# Patient Record
Sex: Male | Born: 1950 | Race: White | Hispanic: No | Marital: Single | Smoking: Former smoker
Health system: Southern US, Community
[De-identification: ages and names within clinical notes are randomized; demographics above are authoritative.]

## PROBLEM LIST (undated history)

## (undated) DIAGNOSIS — J45909 Unspecified asthma, uncomplicated: Secondary | ICD-10-CM

## (undated) DIAGNOSIS — I10 Essential (primary) hypertension: Secondary | ICD-10-CM

## (undated) HISTORY — DX: Unspecified asthma, uncomplicated: J45.909

## (undated) HISTORY — DX: Essential (primary) hypertension: I10

## (undated) HISTORY — PX: TONSILLECTOMY: SUR1361

---

## 2005-06-19 ENCOUNTER — Encounter: Admission: RE | Admit: 2005-06-19 | Discharge: 2005-06-19 | Payer: Self-pay | Admitting: Occupational Medicine

## 2006-03-16 ENCOUNTER — Ambulatory Visit: Payer: Self-pay | Admitting: Gastroenterology

## 2006-03-30 ENCOUNTER — Ambulatory Visit: Payer: Self-pay | Admitting: Gastroenterology

## 2008-05-16 ENCOUNTER — Encounter: Admission: RE | Admit: 2008-05-16 | Discharge: 2008-05-16 | Payer: Self-pay | Admitting: Unknown Physician Specialty

## 2009-11-01 IMAGING — CR DG CHEST 2V
2 series · 2 of 2 positions shown · non-contrast
Comparison: None

CLINICAL DATA: Nonsmoker with wheezing and cough.

CHEST - 2 VIEW

[view not recorded (1 of 2)]
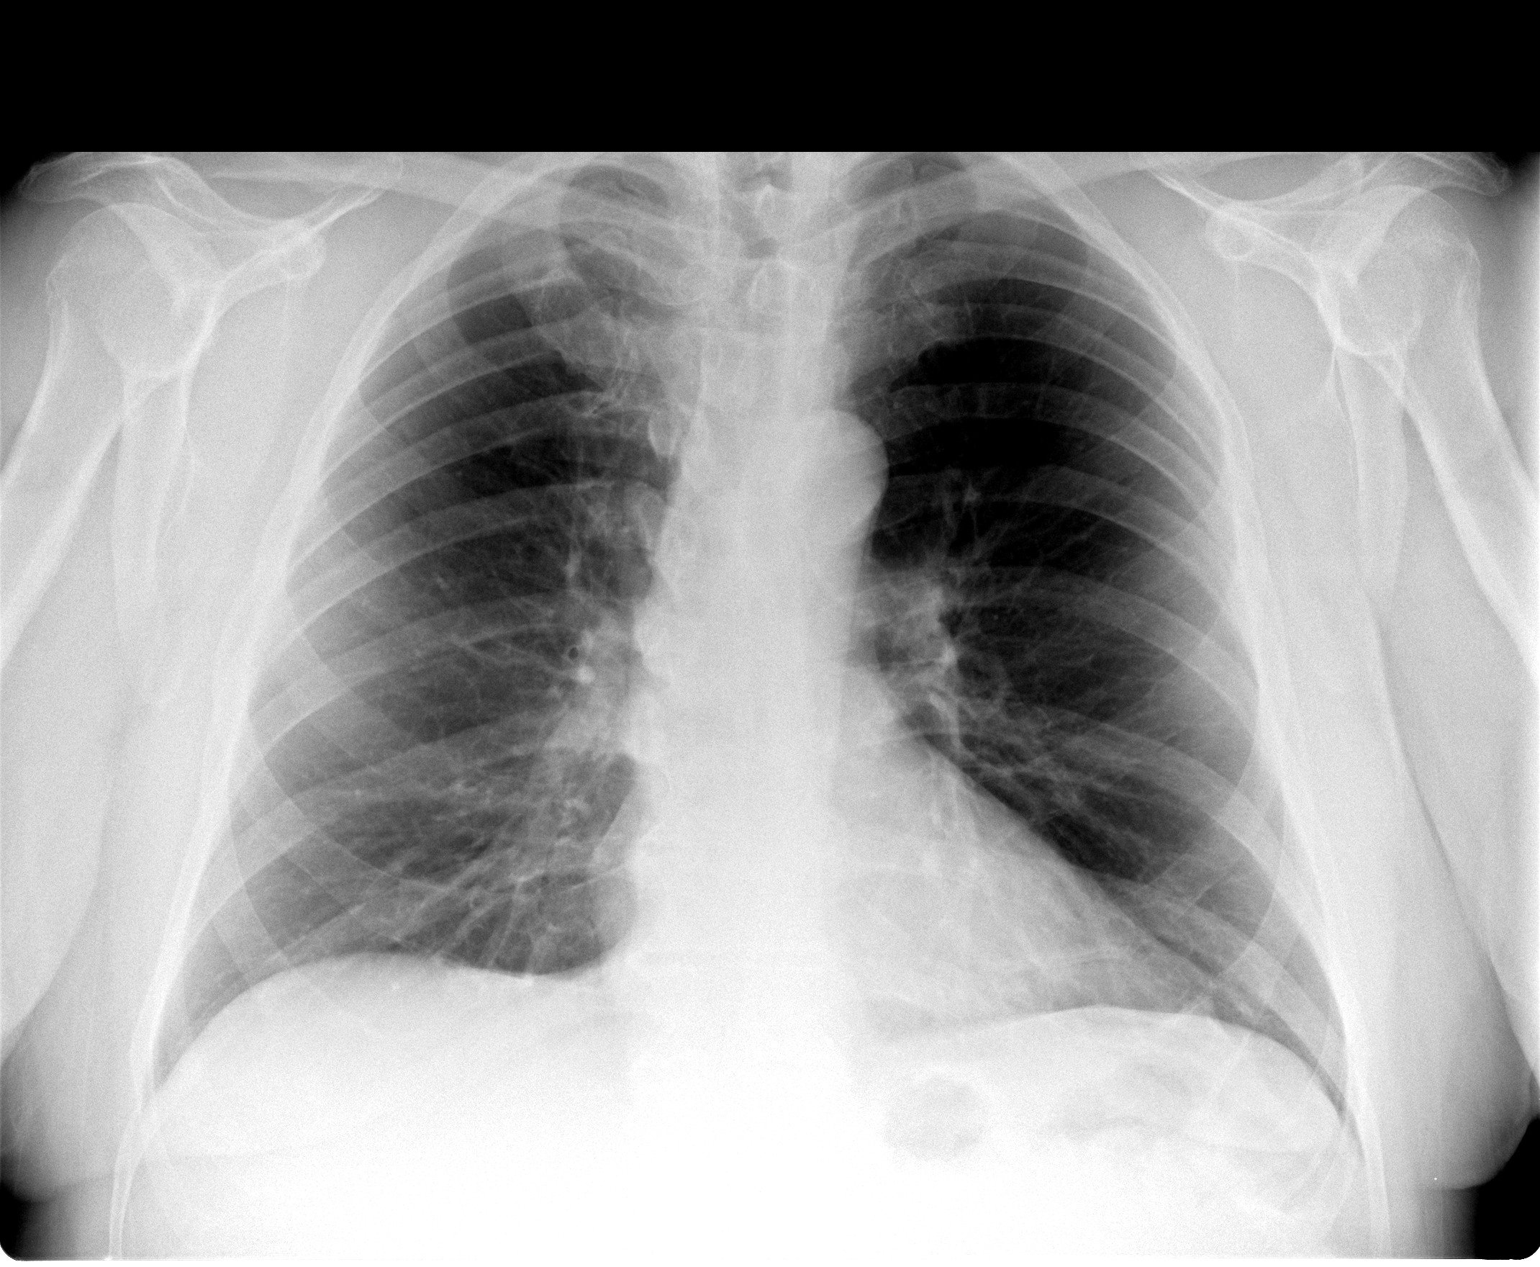

[view not recorded (2 of 2)]
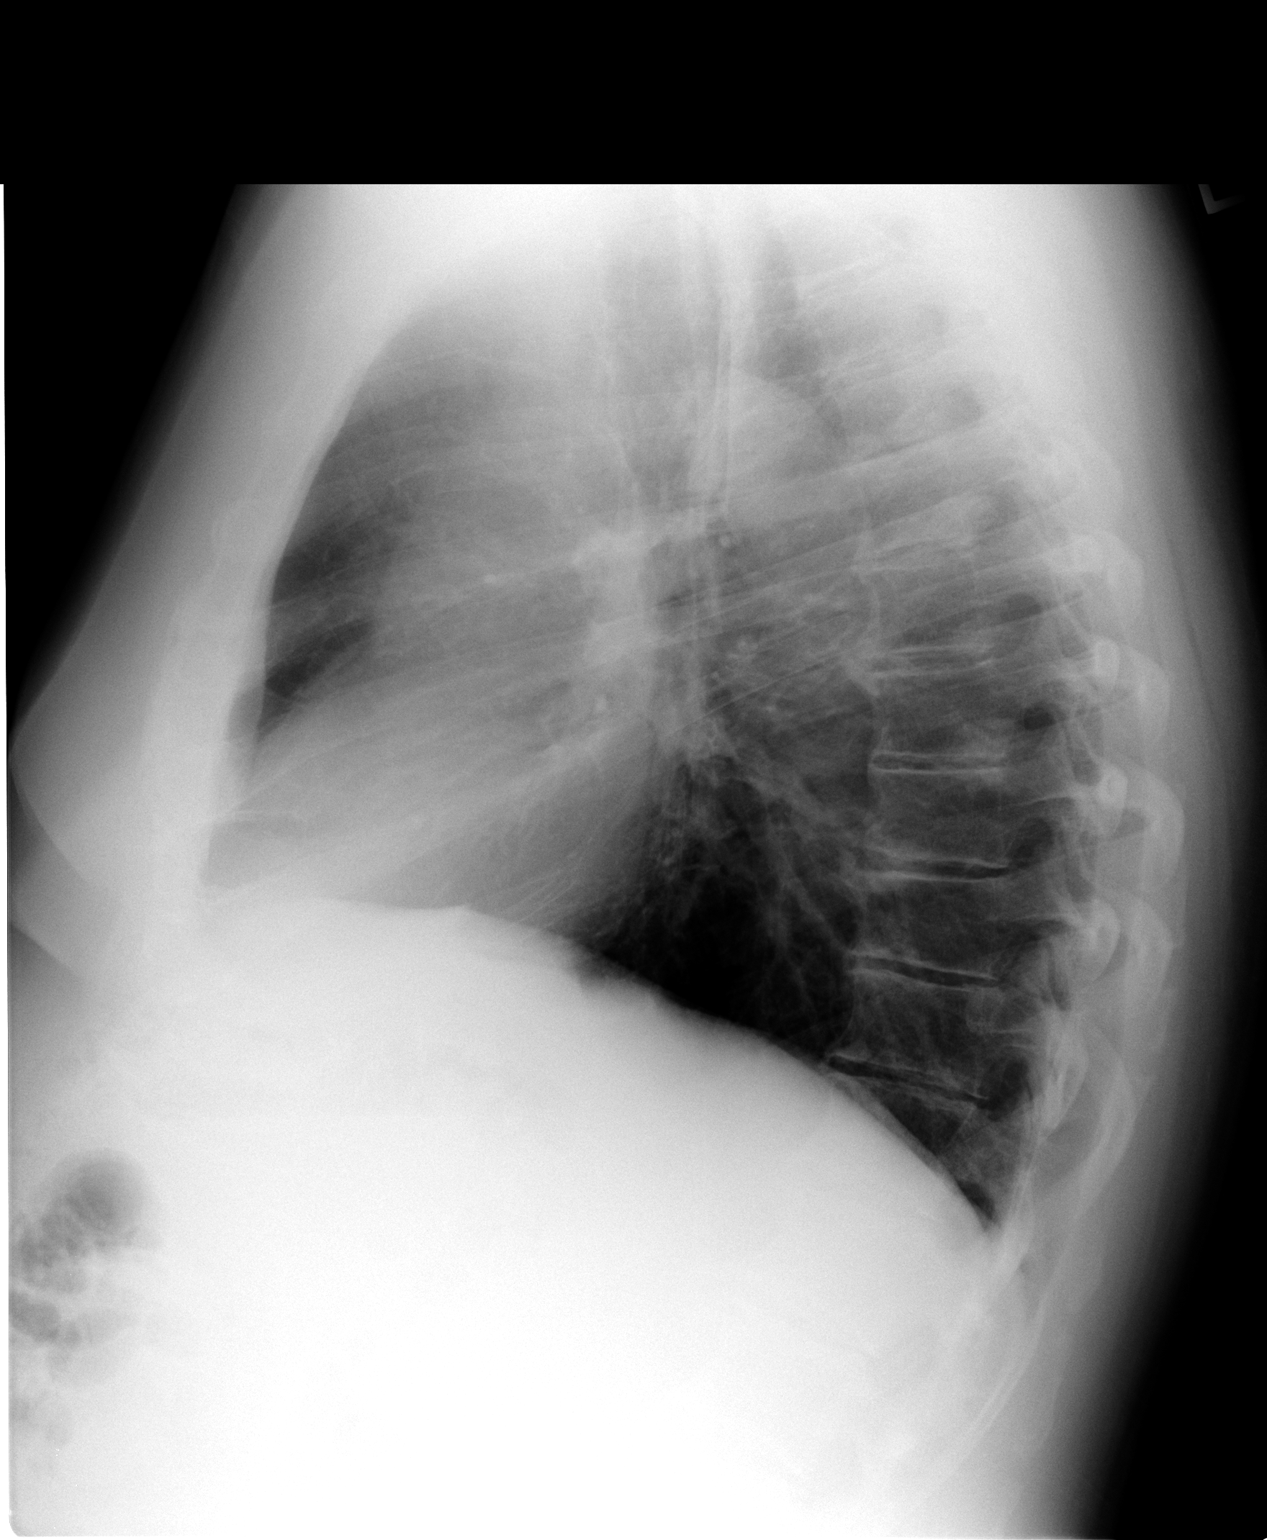

[2 of 2 positions shown; findings below may reference images not displayed]

FINDINGS: The heart size and mediastinal contours are normal.  The
lungs are clear.  There is no pleural effusion or pneumothorax.  No
acute osseous findings are seen.
IMPRESSION: No active cardiopulmonary process demonstrated.

## 2010-05-06 ENCOUNTER — Encounter
Admission: RE | Admit: 2010-05-06 | Discharge: 2010-05-06 | Payer: Self-pay | Source: Home / Self Care | Attending: Unknown Physician Specialty | Admitting: Unknown Physician Specialty

## 2011-10-22 IMAGING — CR DG SHOULDER 2+V*L*
3 series · 3 of 3 positions shown · non-contrast
Comparison: None.

CLINICAL DATA: Left shoulder pain

LEFT SHOULDER - 2+ VIEW

[view not recorded (1 of 3)]
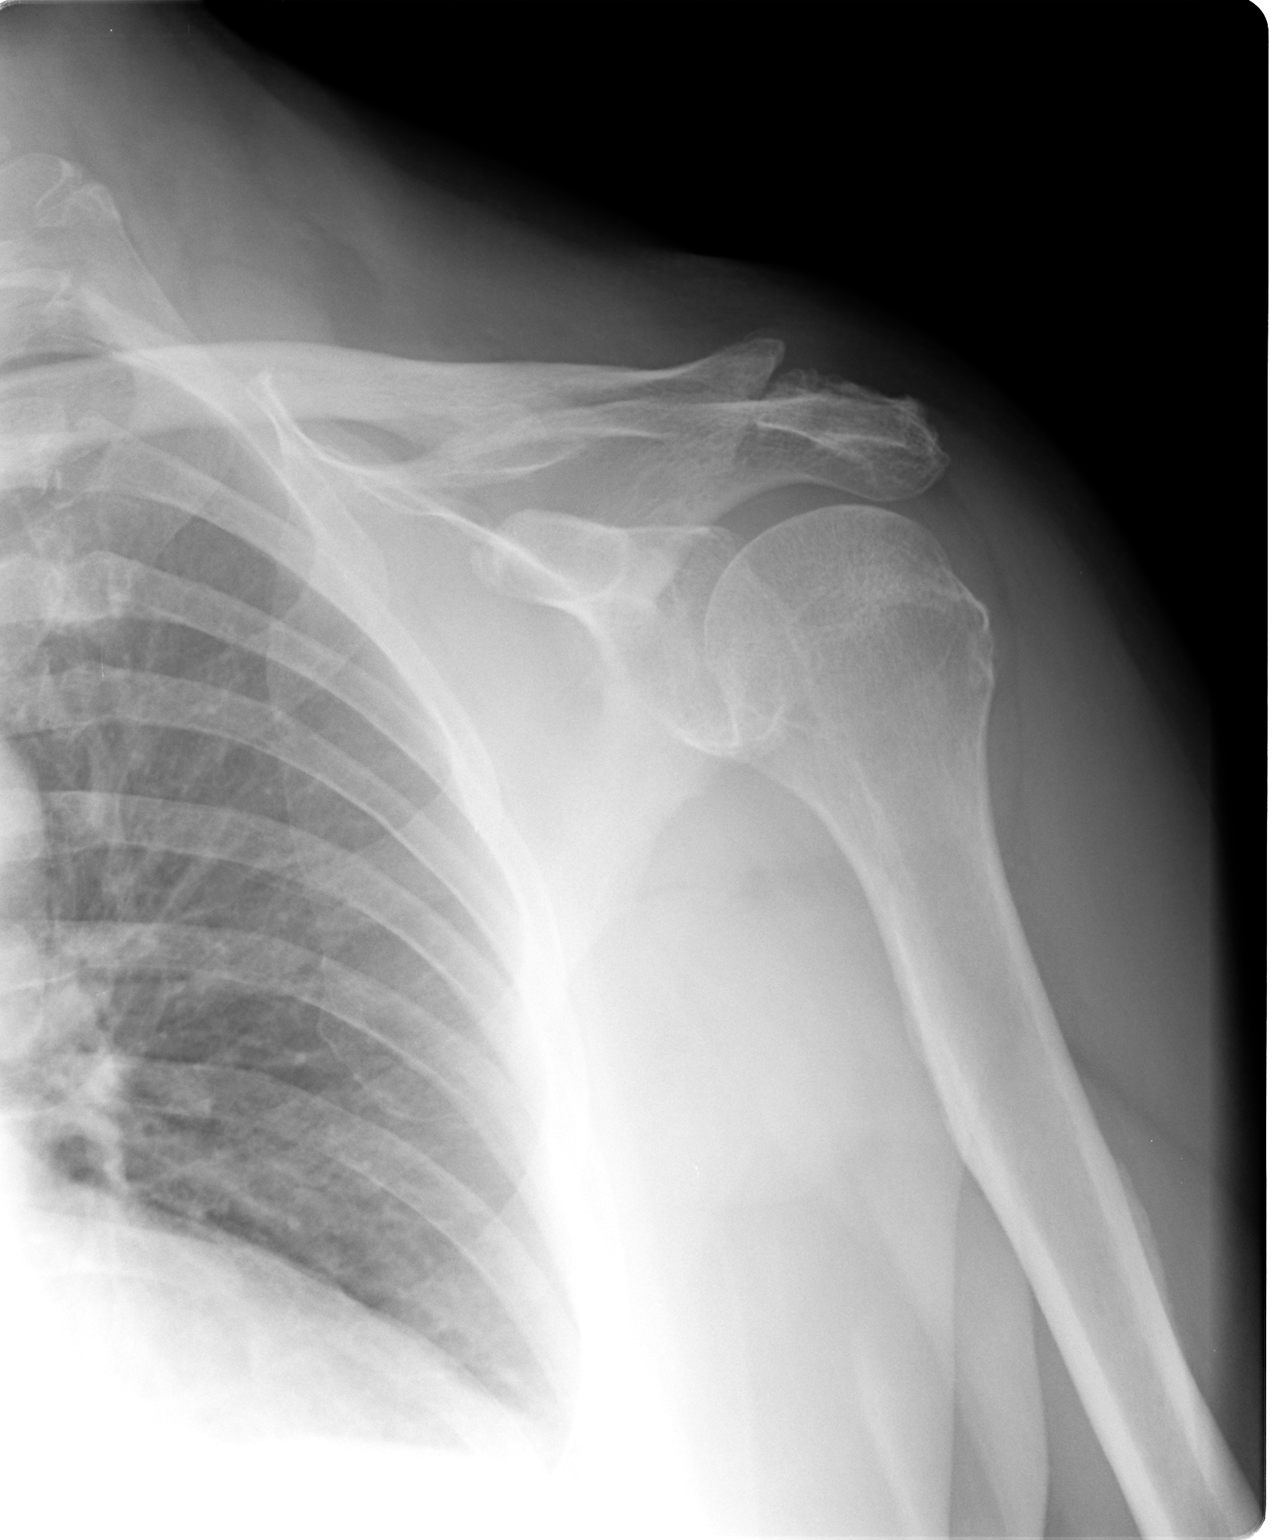

[view not recorded (2 of 3)]
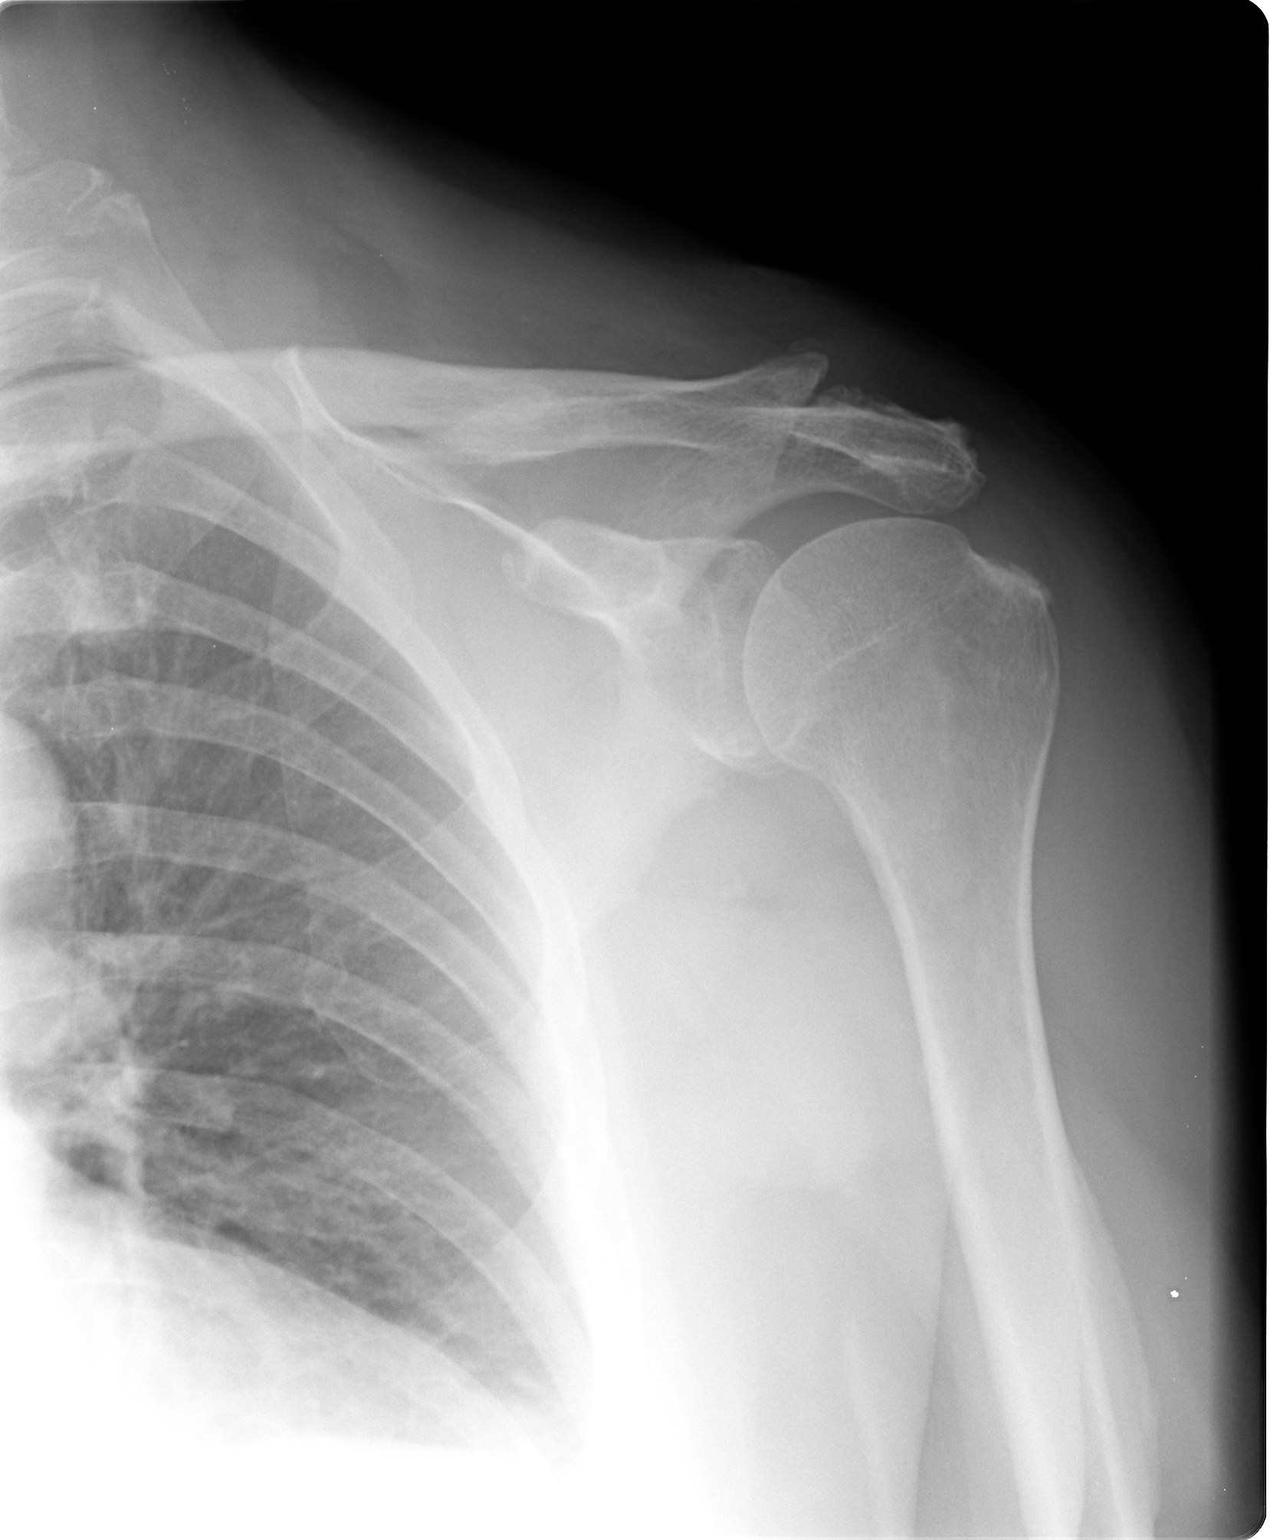

[view not recorded (3 of 3)]
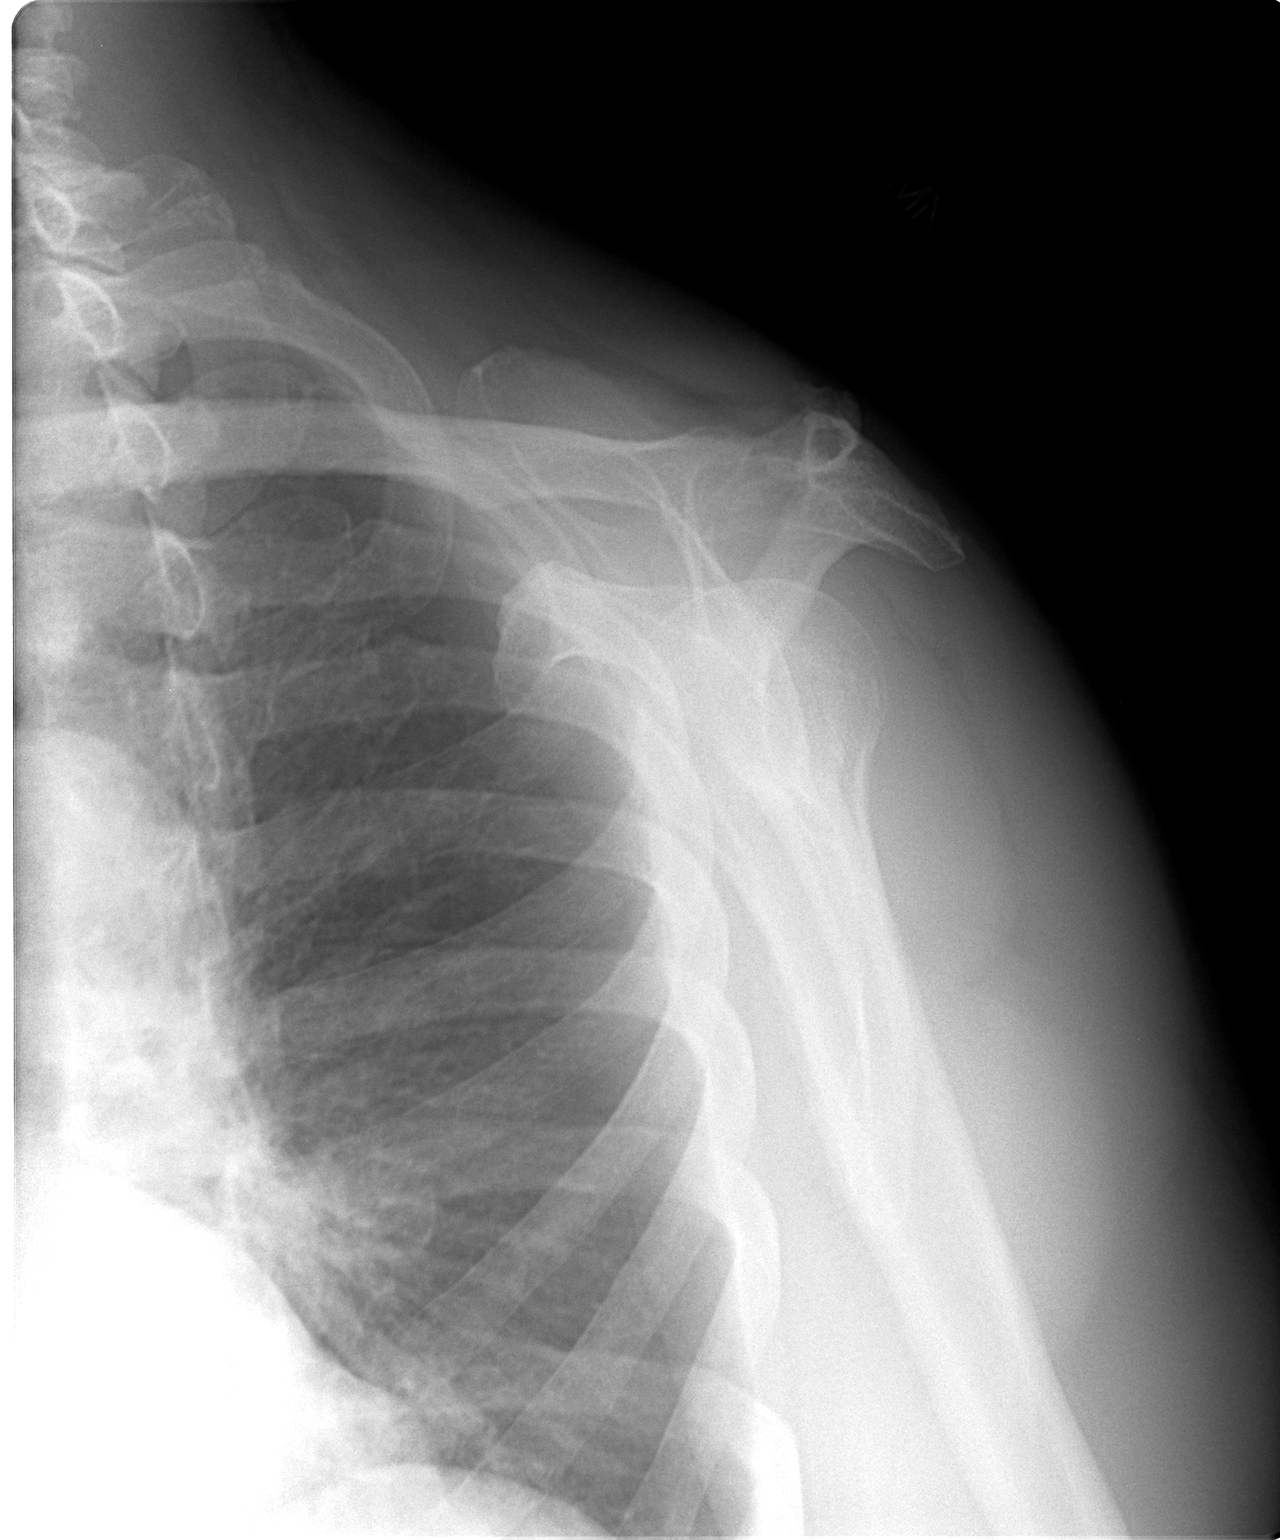

[3 of 3 positions shown; findings below may reference images not displayed]

FINDINGS: No fracture or dislocation.  There are significant
degenerative changes of the AC joint.  No soft tissue
calcifications.
IMPRESSION: Degenerative changes of the AC joint - otherwise unremarkable.

## 2015-05-27 HISTORY — PX: COLONOSCOPY: SHX174

## 2016-03-12 ENCOUNTER — Encounter: Payer: Self-pay | Admitting: Gastroenterology

## 2017-03-25 ENCOUNTER — Encounter: Payer: Self-pay | Admitting: Unknown Physician Specialty

## 2017-04-06 ENCOUNTER — Encounter: Payer: Self-pay | Admitting: Gastroenterology

## 2017-05-21 ENCOUNTER — Ambulatory Visit (AMBULATORY_SURGERY_CENTER): Payer: Self-pay | Admitting: *Deleted

## 2017-05-21 ENCOUNTER — Other Ambulatory Visit: Payer: Self-pay

## 2017-05-21 VITALS — Ht 71.0 in | Wt 234.0 lb

## 2017-05-21 DIAGNOSIS — Z1211 Encounter for screening for malignant neoplasm of colon: Secondary | ICD-10-CM

## 2017-05-21 MED ORDER — PEG-KCL-NACL-NASULF-NA ASC-C 140 G PO SOLR: 1.0000 | Freq: Once | ORAL | 0 refills | Status: AC

## 2017-05-21 NOTE — Progress Notes (Signed)
Patient denies any allergies to eggs or soy. Patient denies any problems with anesthesia/sedation. Patient denies any oxygen use at home. Patient denies taking any diet/weight loss medications or blood thinners. EMMI education declined by patient.

## 2017-06-01 ENCOUNTER — Telehealth: Payer: Self-pay | Admitting: Gastroenterology

## 2017-06-01 DIAGNOSIS — Z1211 Encounter for screening for malignant neoplasm of colon: Secondary | ICD-10-CM

## 2017-06-01 MED ORDER — PEG-KCL-NACL-NASULF-NA ASC-C 140 G PO SOLR: 1.0000 | ORAL | 0 refills | Status: DC

## 2017-06-01 NOTE — Telephone Encounter (Signed)
Called patient and spoke to wife and she states that the pharmacy did not have Plenvu in stock and would have to order it.  The patient's procedure is 1/9 so I left a sample for patient to come pick up.   Elizabeth Palau, CMA  [facility]

## 2017-06-01 NOTE — Telephone Encounter (Signed)
Sample given  Plenvu   Lot [id number]  Exp. 10/2018  1 kit

## 2017-06-03 ENCOUNTER — Other Ambulatory Visit: Payer: Self-pay

## 2017-06-03 ENCOUNTER — Ambulatory Visit (AMBULATORY_SURGERY_CENTER): Payer: 59 | Admitting: Gastroenterology

## 2017-06-03 ENCOUNTER — Encounter: Payer: Self-pay | Admitting: Gastroenterology

## 2017-06-03 VITALS — BP 126/73 | HR 69 | Temp 98.0°F | Resp 12 | Ht 71.0 in | Wt 234.0 lb

## 2017-06-03 DIAGNOSIS — K635 Polyp of colon: Secondary | ICD-10-CM

## 2017-06-03 DIAGNOSIS — Z1211 Encounter for screening for malignant neoplasm of colon: Secondary | ICD-10-CM | POA: Diagnosis not present

## 2017-06-03 DIAGNOSIS — D126 Benign neoplasm of colon, unspecified: Secondary | ICD-10-CM

## 2017-06-03 DIAGNOSIS — D122 Benign neoplasm of ascending colon: Secondary | ICD-10-CM

## 2017-06-03 DIAGNOSIS — D120 Benign neoplasm of cecum: Secondary | ICD-10-CM

## 2017-06-03 DIAGNOSIS — D123 Benign neoplasm of transverse colon: Secondary | ICD-10-CM

## 2017-06-03 MED ORDER — SODIUM CHLORIDE 0.9 % IV SOLN: 500.0000 mL | Freq: Once | INTRAVENOUS | Status: DC

## 2017-06-03 NOTE — Progress Notes (Signed)
A/ox3 pleased with MAC, report to Angela RN 

## 2017-06-03 NOTE — Op Note (Addendum)
[place] Patient Name: Bryan Riley Procedure Date: 06/03/2017 9:14 AM MRN: [id number] Endoscopist: Remo Lipps P. Armbruster MD, MD Age: 67 Referring MD:  Date of Birth: March 21, 1951 Gender: Male Account #: [id number] Procedure:                Colonoscopy Indications:              Screening for colorectal malignant neoplasm Medicines:                Monitored Anesthesia Care Procedure:                Pre-Anesthesia Assessment:                           - Prior to the procedure, a History and Physical                            was performed, and patient medications and                            allergies were reviewed. The patient's tolerance of                            previous anesthesia was also reviewed. The risks                            and benefits of the procedure and the sedation                            options and risks were discussed with the patient.                            All questions were answered, and informed consent                            was obtained. Prior Anticoagulants: The patient has                            taken no previous anticoagulant or antiplatelet                            agents. ASA Grade Assessment: II - A patient with                            mild systemic disease. After reviewing the risks                            and benefits, the patient was deemed in                            satisfactory condition to undergo the procedure.                           After obtaining informed consent, the colonoscope  was passed under direct vision. Throughout the                            procedure, the patient's blood pressure, pulse, and                            oxygen saturations were monitored continuously. The                            Colonoscope was introduced through the anus and                            advanced to the the cecum, identified by                            appendiceal orifice and  ileocecal valve. The                            colonoscopy was performed without difficulty. The                            patient tolerated the procedure well. The quality                            of the bowel preparation was good. The ileocecal                            valve, appendiceal orifice, and rectum were                            photographed. Scope In: 9:18:15 AM Scope Out: 9:36:13 AM Scope Withdrawal Time: 0 hours 15 minutes 16 seconds  Total Procedure Duration: 0 hours 17 minutes 58 seconds  Findings:                 The perianal and digital rectal examinations were                            normal.                           A diminutive polyp was found in the ileocecal                            valve. The polyp was sessile. The polyp was removed                            with a cold biopsy forceps. Resection and retrieval                            were complete.                           A 5 mm polyp was found in the ascending colon. The  polyp was sessile. The polyp was removed with a                            cold snare. Resection and retrieval were complete.                           A 4 mm polyp was found in the hepatic flexure. The                            polyp was sessile. The polyp was removed with a                            cold snare. Resection was complete, but the polyp                            tissue was not retrieved (could not find the polyp                            following suctioning it within the channel of the                            colonoscope).                           Many medium-mouthed diverticula were found in the                            entire colon - mild in right and transverse colon,                            severe in left colon.                           Internal hemorrhoids were found during retroflexion.                           The exam was otherwise without  abnormality. Complications:            No immediate complications. Estimated blood loss:                            Minimal. Estimated Blood Loss:     Estimated blood loss was minimal. Impression:               - One diminutive polyp at the ileocecal valve,                            removed with a cold biopsy forceps. Resected and                            retrieved.                           - One 5 mm polyp in the ascending colon, removed  with a cold snare. Resected and retrieved.                           - One 4 mm polyp at the hepatic flexure, removed                            with a cold snare. Complete resection. Polyp tissue                            not retrieved.                           - Diverticulosis in the entire examined colon.                           - Internal hemorrhoids.                           - The examination was otherwise normal. Recommendation:           - Patient has a contact number available for                            emergencies. The signs and symptoms of potential                            delayed complications were discussed with the                            patient. Return to normal activities tomorrow.                            Written discharge instructions were provided to the                            patient.                           - Resume previous diet.                           - Continue present medications.                           - Await pathology results.                           - Repeat colonoscopy is recommended for                            surveillance. The colonoscopy date will be                            determined after pathology results from today's                            exam become available  for review.                           - No ibuprofen, naproxen, or other non-steroidal                            anti-inflammatory drugs for 2 weeks after polyp                             removal. Remo Lipps P. Armbruster MD, MD 06/03/2017 9:40:12 AM This report has been signed electronically.

## 2017-06-03 NOTE — Progress Notes (Signed)
Called to room to assist during endoscopic procedure.  Patient ID and intended procedure confirmed with present staff. Received instructions for my participation in the procedure from the performing physician.  

## 2017-06-03 NOTE — Progress Notes (Signed)
Pt's states no medical or surgical changes since previsit or office visit. 

## 2017-06-03 NOTE — Patient Instructions (Signed)
**  Handouts given to patient on hemorrhoids, polyps, and diverticulosis**   YOU HAD AN ENDOSCOPIC PROCEDURE TODAY AT [facility]:   Refer to the procedure report that was given to you for any specific questions about what was found during the examination.  If the procedure report does not answer your questions, please call your gastroenterologist to clarify.  If you requested that your care partner not be given the details of your procedure findings, then the procedure report has been included in a sealed envelope for you to review at your convenience later.  YOU SHOULD EXPECT: Some feelings of bloating in the abdomen. Passage of more gas than usual.  Walking can help get rid of the air that was put into your GI tract during the procedure and reduce the bloating. If you had a lower endoscopy (such as a colonoscopy or flexible sigmoidoscopy) you may notice spotting of blood in your stool or on the toilet paper. If you underwent a bowel prep for your procedure, you may not have a normal bowel movement for a few days.  Please Note:  You might notice some irritation and congestion in your nose or some drainage.  This is from the oxygen used during your procedure.  There is no need for concern and it should clear up in a day or so.  SYMPTOMS TO REPORT IMMEDIATELY:   Following lower endoscopy (colonoscopy or flexible sigmoidoscopy):  Excessive amounts of blood in the stool  Significant tenderness or worsening of abdominal pains  Swelling of the abdomen that is new, acute  Fever of 100F or higher  For urgent or emergent issues, a gastroenterologist can be reached at any hour by calling [phone].   DIET:  We do recommend a small meal at first, but then you may proceed to your regular diet.  Drink plenty of fluids but you should avoid alcoholic beverages for 24 hours.  ACTIVITY:  You should plan to take it easy for the rest of today and you should NOT DRIVE or use heavy machinery  until tomorrow (because of the sedation medicines used during the test).    FOLLOW UP: Our staff will call the number listed on your records the next business day following your procedure to check on you and address any questions or concerns that you may have regarding the information given to you following your procedure. If we do not reach you, we will leave a message.  However, if you are feeling well and you are not experiencing any problems, there is no need to return our call.  We will assume that you have returned to your regular daily activities without incident.  If any biopsies were taken you will be contacted by phone or by letter within the next 1-3 weeks.  Please call us at [phone] if you have not heard about the biopsies in 3 weeks.    SIGNATURES/CONFIDENTIALITY: You and/or your care partner have signed paperwork which will be entered into your electronic medical record.  These signatures attest to the fact that that the information above on your After Visit Summary has been reviewed and is understood.  Full responsibility of the confidentiality of this discharge information lies with you and/or your care-partner.

## 2017-06-04 ENCOUNTER — Telehealth: Payer: Self-pay | Admitting: *Deleted

## 2017-06-04 ENCOUNTER — Telehealth: Payer: Self-pay

## 2017-06-04 NOTE — Telephone Encounter (Signed)
  Follow up Call-  Call back number 06/03/2017  Post procedure Call Back phone  # [phone]  Permission to leave phone message Yes  Some recent data might be hidden   no answer, voicemail not set up so not able to leave a message

## 2017-06-04 NOTE — Telephone Encounter (Signed)
No voice mail set uo. Left no message

## 2017-06-09 ENCOUNTER — Encounter: Payer: Self-pay | Admitting: Gastroenterology

## 2018-08-03 DIAGNOSIS — E7800 Pure hypercholesterolemia, unspecified: Secondary | ICD-10-CM | POA: Diagnosis not present

## 2018-08-03 DIAGNOSIS — R739 Hyperglycemia, unspecified: Secondary | ICD-10-CM | POA: Diagnosis not present

## 2018-08-03 DIAGNOSIS — R7989 Other specified abnormal findings of blood chemistry: Secondary | ICD-10-CM | POA: Diagnosis not present

## 2018-08-03 DIAGNOSIS — I10 Essential (primary) hypertension: Secondary | ICD-10-CM | POA: Diagnosis not present

## 2018-09-14 DIAGNOSIS — L03119 Cellulitis of unspecified part of limb: Secondary | ICD-10-CM | POA: Diagnosis not present

## 2018-09-14 DIAGNOSIS — L309 Dermatitis, unspecified: Secondary | ICD-10-CM | POA: Diagnosis not present

## 2018-09-14 DIAGNOSIS — R600 Localized edema: Secondary | ICD-10-CM | POA: Diagnosis not present

## 2018-12-02 DIAGNOSIS — Z85828 Personal history of other malignant neoplasm of skin: Secondary | ICD-10-CM | POA: Diagnosis not present

## 2018-12-02 DIAGNOSIS — Z08 Encounter for follow-up examination after completed treatment for malignant neoplasm: Secondary | ICD-10-CM | POA: Diagnosis not present

## 2018-12-02 DIAGNOSIS — D1722 Benign lipomatous neoplasm of skin and subcutaneous tissue of left arm: Secondary | ICD-10-CM | POA: Diagnosis not present

## 2018-12-02 DIAGNOSIS — L2089 Other atopic dermatitis: Secondary | ICD-10-CM | POA: Diagnosis not present

## 2018-12-02 DIAGNOSIS — L821 Other seborrheic keratosis: Secondary | ICD-10-CM | POA: Diagnosis not present

## 2019-02-03 DIAGNOSIS — Z23 Encounter for immunization: Secondary | ICD-10-CM | POA: Diagnosis not present

## 2019-02-23 DIAGNOSIS — Z0000 Encounter for general adult medical examination without abnormal findings: Secondary | ICD-10-CM | POA: Diagnosis not present

## 2019-02-23 DIAGNOSIS — R739 Hyperglycemia, unspecified: Secondary | ICD-10-CM | POA: Diagnosis not present

## 2019-02-23 DIAGNOSIS — E7800 Pure hypercholesterolemia, unspecified: Secondary | ICD-10-CM | POA: Diagnosis not present

## 2019-02-23 DIAGNOSIS — R748 Abnormal levels of other serum enzymes: Secondary | ICD-10-CM | POA: Diagnosis not present

## 2019-02-23 DIAGNOSIS — R5383 Other fatigue: Secondary | ICD-10-CM | POA: Diagnosis not present

## 2019-02-23 DIAGNOSIS — J45909 Unspecified asthma, uncomplicated: Secondary | ICD-10-CM | POA: Diagnosis not present

## 2019-02-23 DIAGNOSIS — I10 Essential (primary) hypertension: Secondary | ICD-10-CM | POA: Diagnosis not present

## 2019-08-16 DIAGNOSIS — R739 Hyperglycemia, unspecified: Secondary | ICD-10-CM | POA: Diagnosis not present

## 2019-08-16 DIAGNOSIS — J45909 Unspecified asthma, uncomplicated: Secondary | ICD-10-CM | POA: Diagnosis not present

## 2019-08-16 DIAGNOSIS — E7800 Pure hypercholesterolemia, unspecified: Secondary | ICD-10-CM | POA: Diagnosis not present

## 2019-08-16 DIAGNOSIS — I10 Essential (primary) hypertension: Secondary | ICD-10-CM | POA: Diagnosis not present

## 2020-02-20 DIAGNOSIS — Z0000 Encounter for general adult medical examination without abnormal findings: Secondary | ICD-10-CM | POA: Diagnosis not present

## 2020-02-20 DIAGNOSIS — Z23 Encounter for immunization: Secondary | ICD-10-CM | POA: Diagnosis not present

## 2020-02-20 DIAGNOSIS — I10 Essential (primary) hypertension: Secondary | ICD-10-CM | POA: Diagnosis not present

## 2020-02-20 DIAGNOSIS — R739 Hyperglycemia, unspecified: Secondary | ICD-10-CM | POA: Diagnosis not present

## 2020-02-20 DIAGNOSIS — J45909 Unspecified asthma, uncomplicated: Secondary | ICD-10-CM | POA: Diagnosis not present

## 2020-02-20 DIAGNOSIS — E7800 Pure hypercholesterolemia, unspecified: Secondary | ICD-10-CM | POA: Diagnosis not present

## 2020-02-21 DIAGNOSIS — E7800 Pure hypercholesterolemia, unspecified: Secondary | ICD-10-CM | POA: Diagnosis not present

## 2020-02-21 DIAGNOSIS — R748 Abnormal levels of other serum enzymes: Secondary | ICD-10-CM | POA: Diagnosis not present

## 2020-02-21 DIAGNOSIS — I10 Essential (primary) hypertension: Secondary | ICD-10-CM | POA: Diagnosis not present

## 2020-02-21 DIAGNOSIS — Z125 Encounter for screening for malignant neoplasm of prostate: Secondary | ICD-10-CM | POA: Diagnosis not present

## 2020-02-21 DIAGNOSIS — R972 Elevated prostate specific antigen [PSA]: Secondary | ICD-10-CM | POA: Diagnosis not present

## 2020-02-21 DIAGNOSIS — R5383 Other fatigue: Secondary | ICD-10-CM | POA: Diagnosis not present

## 2020-02-21 DIAGNOSIS — R739 Hyperglycemia, unspecified: Secondary | ICD-10-CM | POA: Diagnosis not present

## 2020-02-29 DIAGNOSIS — C44729 Squamous cell carcinoma of skin of left lower limb, including hip: Secondary | ICD-10-CM | POA: Diagnosis not present

## 2020-02-29 DIAGNOSIS — Z85828 Personal history of other malignant neoplasm of skin: Secondary | ICD-10-CM | POA: Diagnosis not present

## 2020-02-29 DIAGNOSIS — L281 Prurigo nodularis: Secondary | ICD-10-CM | POA: Diagnosis not present

## 2020-02-29 DIAGNOSIS — L821 Other seborrheic keratosis: Secondary | ICD-10-CM | POA: Diagnosis not present

## 2020-02-29 DIAGNOSIS — C44529 Squamous cell carcinoma of skin of other part of trunk: Secondary | ICD-10-CM | POA: Diagnosis not present

## 2020-02-29 DIAGNOSIS — D1722 Benign lipomatous neoplasm of skin and subcutaneous tissue of left arm: Secondary | ICD-10-CM | POA: Diagnosis not present

## 2020-04-01 DIAGNOSIS — Z23 Encounter for immunization: Secondary | ICD-10-CM | POA: Diagnosis not present

## 2020-04-11 DIAGNOSIS — Z85828 Personal history of other malignant neoplasm of skin: Secondary | ICD-10-CM | POA: Diagnosis not present

## 2020-04-11 DIAGNOSIS — L820 Inflamed seborrheic keratosis: Secondary | ICD-10-CM | POA: Diagnosis not present

## 2020-04-11 DIAGNOSIS — L281 Prurigo nodularis: Secondary | ICD-10-CM | POA: Diagnosis not present

## 2020-04-11 DIAGNOSIS — L2089 Other atopic dermatitis: Secondary | ICD-10-CM | POA: Diagnosis not present

## 2020-04-11 DIAGNOSIS — D485 Neoplasm of uncertain behavior of skin: Secondary | ICD-10-CM | POA: Diagnosis not present

## 2020-04-18 DIAGNOSIS — E7800 Pure hypercholesterolemia, unspecified: Secondary | ICD-10-CM | POA: Diagnosis not present

## 2020-08-17 DIAGNOSIS — R748 Abnormal levels of other serum enzymes: Secondary | ICD-10-CM | POA: Diagnosis not present

## 2020-08-17 DIAGNOSIS — I10 Essential (primary) hypertension: Secondary | ICD-10-CM | POA: Diagnosis not present

## 2020-08-17 DIAGNOSIS — E7800 Pure hypercholesterolemia, unspecified: Secondary | ICD-10-CM | POA: Diagnosis not present

## 2020-08-17 DIAGNOSIS — J45909 Unspecified asthma, uncomplicated: Secondary | ICD-10-CM | POA: Diagnosis not present

## 2020-08-17 DIAGNOSIS — R739 Hyperglycemia, unspecified: Secondary | ICD-10-CM | POA: Diagnosis not present

## 2020-09-03 DIAGNOSIS — Z85828 Personal history of other malignant neoplasm of skin: Secondary | ICD-10-CM | POA: Diagnosis not present

## 2020-09-03 DIAGNOSIS — L2089 Other atopic dermatitis: Secondary | ICD-10-CM | POA: Diagnosis not present

## 2020-09-03 DIAGNOSIS — C44519 Basal cell carcinoma of skin of other part of trunk: Secondary | ICD-10-CM | POA: Diagnosis not present

## 2020-09-03 DIAGNOSIS — C44619 Basal cell carcinoma of skin of left upper limb, including shoulder: Secondary | ICD-10-CM | POA: Diagnosis not present

## 2020-09-06 DIAGNOSIS — J45909 Unspecified asthma, uncomplicated: Secondary | ICD-10-CM | POA: Diagnosis not present

## 2020-10-27 DIAGNOSIS — Z23 Encounter for immunization: Secondary | ICD-10-CM | POA: Diagnosis not present

## 2020-12-26 DIAGNOSIS — C44519 Basal cell carcinoma of skin of other part of trunk: Secondary | ICD-10-CM | POA: Diagnosis not present

## 2020-12-26 DIAGNOSIS — Z85828 Personal history of other malignant neoplasm of skin: Secondary | ICD-10-CM | POA: Diagnosis not present

## 2020-12-26 DIAGNOSIS — D1801 Hemangioma of skin and subcutaneous tissue: Secondary | ICD-10-CM | POA: Diagnosis not present

## 2020-12-26 DIAGNOSIS — L821 Other seborrheic keratosis: Secondary | ICD-10-CM | POA: Diagnosis not present

## 2020-12-26 DIAGNOSIS — L2089 Other atopic dermatitis: Secondary | ICD-10-CM | POA: Diagnosis not present

## 2021-02-15 DIAGNOSIS — H35033 Hypertensive retinopathy, bilateral: Secondary | ICD-10-CM | POA: Diagnosis not present

## 2021-02-15 DIAGNOSIS — H2513 Age-related nuclear cataract, bilateral: Secondary | ICD-10-CM | POA: Diagnosis not present

## 2021-03-01 DIAGNOSIS — Z125 Encounter for screening for malignant neoplasm of prostate: Secondary | ICD-10-CM | POA: Diagnosis not present

## 2021-03-01 DIAGNOSIS — J45909 Unspecified asthma, uncomplicated: Secondary | ICD-10-CM | POA: Diagnosis not present

## 2021-03-01 DIAGNOSIS — R739 Hyperglycemia, unspecified: Secondary | ICD-10-CM | POA: Diagnosis not present

## 2021-03-01 DIAGNOSIS — R972 Elevated prostate specific antigen [PSA]: Secondary | ICD-10-CM | POA: Diagnosis not present

## 2021-03-01 DIAGNOSIS — Z0000 Encounter for general adult medical examination without abnormal findings: Secondary | ICD-10-CM | POA: Diagnosis not present

## 2021-03-01 DIAGNOSIS — R5383 Other fatigue: Secondary | ICD-10-CM | POA: Diagnosis not present

## 2021-03-01 DIAGNOSIS — R748 Abnormal levels of other serum enzymes: Secondary | ICD-10-CM | POA: Diagnosis not present

## 2021-03-01 DIAGNOSIS — E7800 Pure hypercholesterolemia, unspecified: Secondary | ICD-10-CM | POA: Diagnosis not present

## 2021-03-01 DIAGNOSIS — I10 Essential (primary) hypertension: Secondary | ICD-10-CM | POA: Diagnosis not present

## 2021-03-12 DIAGNOSIS — Z23 Encounter for immunization: Secondary | ICD-10-CM | POA: Diagnosis not present

## 2021-07-01 DIAGNOSIS — Z85828 Personal history of other malignant neoplasm of skin: Secondary | ICD-10-CM | POA: Diagnosis not present

## 2021-07-01 DIAGNOSIS — L821 Other seborrheic keratosis: Secondary | ICD-10-CM | POA: Diagnosis not present

## 2021-07-01 DIAGNOSIS — L570 Actinic keratosis: Secondary | ICD-10-CM | POA: Diagnosis not present

## 2021-07-01 DIAGNOSIS — C44519 Basal cell carcinoma of skin of other part of trunk: Secondary | ICD-10-CM | POA: Diagnosis not present

## 2021-07-01 DIAGNOSIS — D1801 Hemangioma of skin and subcutaneous tissue: Secondary | ICD-10-CM | POA: Diagnosis not present

## 2021-08-27 DIAGNOSIS — E7800 Pure hypercholesterolemia, unspecified: Secondary | ICD-10-CM | POA: Diagnosis not present

## 2021-08-27 DIAGNOSIS — J45909 Unspecified asthma, uncomplicated: Secondary | ICD-10-CM | POA: Diagnosis not present

## 2021-08-27 DIAGNOSIS — R739 Hyperglycemia, unspecified: Secondary | ICD-10-CM | POA: Diagnosis not present

## 2021-08-27 DIAGNOSIS — I10 Essential (primary) hypertension: Secondary | ICD-10-CM | POA: Diagnosis not present

## 2021-08-27 DIAGNOSIS — R748 Abnormal levels of other serum enzymes: Secondary | ICD-10-CM | POA: Diagnosis not present

## 2021-09-16 DIAGNOSIS — J45909 Unspecified asthma, uncomplicated: Secondary | ICD-10-CM | POA: Diagnosis not present

## 2021-09-16 DIAGNOSIS — E785 Hyperlipidemia, unspecified: Secondary | ICD-10-CM | POA: Diagnosis not present

## 2021-09-16 DIAGNOSIS — I10 Essential (primary) hypertension: Secondary | ICD-10-CM | POA: Diagnosis not present

## 2022-01-06 DIAGNOSIS — Z85828 Personal history of other malignant neoplasm of skin: Secondary | ICD-10-CM | POA: Diagnosis not present

## 2022-01-06 DIAGNOSIS — D485 Neoplasm of uncertain behavior of skin: Secondary | ICD-10-CM | POA: Diagnosis not present

## 2022-01-06 DIAGNOSIS — C4441 Basal cell carcinoma of skin of scalp and neck: Secondary | ICD-10-CM | POA: Diagnosis not present

## 2022-01-06 DIAGNOSIS — L570 Actinic keratosis: Secondary | ICD-10-CM | POA: Diagnosis not present

## 2022-01-06 DIAGNOSIS — L821 Other seborrheic keratosis: Secondary | ICD-10-CM | POA: Diagnosis not present

## 2022-01-06 DIAGNOSIS — D692 Other nonthrombocytopenic purpura: Secondary | ICD-10-CM | POA: Diagnosis not present

## 2022-03-15 DIAGNOSIS — Z23 Encounter for immunization: Secondary | ICD-10-CM | POA: Diagnosis not present

## 2022-03-17 DIAGNOSIS — E785 Hyperlipidemia, unspecified: Secondary | ICD-10-CM | POA: Diagnosis not present

## 2022-03-17 DIAGNOSIS — I10 Essential (primary) hypertension: Secondary | ICD-10-CM | POA: Diagnosis not present

## 2022-03-19 DIAGNOSIS — I10 Essential (primary) hypertension: Secondary | ICD-10-CM | POA: Diagnosis not present

## 2022-03-19 DIAGNOSIS — J45909 Unspecified asthma, uncomplicated: Secondary | ICD-10-CM | POA: Diagnosis not present

## 2022-03-19 DIAGNOSIS — E785 Hyperlipidemia, unspecified: Secondary | ICD-10-CM | POA: Diagnosis not present

## 2022-03-25 DIAGNOSIS — H35033 Hypertensive retinopathy, bilateral: Secondary | ICD-10-CM | POA: Diagnosis not present

## 2022-06-09 ENCOUNTER — Encounter: Payer: Self-pay | Admitting: Gastroenterology
